# Patient Record
Sex: Male | Born: 1980
Health system: Southern US, Community
[De-identification: ages and names within clinical notes are randomized; demographics above are authoritative.]

## PROBLEM LIST (undated history)

## (undated) DIAGNOSIS — G709 Myoneural disorder, unspecified: Secondary | ICD-10-CM

## (undated) HISTORY — DX: Myoneural disorder, unspecified: G70.9

## (undated) HISTORY — PX: FRACTURE SURGERY: SHX138

---

## 2014-12-12 ENCOUNTER — Ambulatory Visit (INDEPENDENT_AMBULATORY_CARE_PROVIDER_SITE_OTHER): Payer: No Typology Code available for payment source

## 2014-12-12 ENCOUNTER — Ambulatory Visit (INDEPENDENT_AMBULATORY_CARE_PROVIDER_SITE_OTHER): Payer: No Typology Code available for payment source | Admitting: Family Medicine

## 2014-12-12 VITALS — BP 120/98 | HR 79 | Temp 98.0°F | Resp 16 | Wt 213.4 lb

## 2014-12-12 DIAGNOSIS — R29898 Other symptoms and signs involving the musculoskeletal system: Secondary | ICD-10-CM

## 2014-12-12 DIAGNOSIS — M545 Low back pain: Secondary | ICD-10-CM

## 2014-12-12 MED ORDER — MELOXICAM 7.5 MG PO TABS: 7.5000 mg | ORAL_TABLET | Freq: Every day | ORAL | Status: DC

## 2014-12-12 MED ORDER — CYCLOBENZAPRINE HCL 10 MG PO TABS: 5.0000 mg | ORAL_TABLET | Freq: Three times a day (TID) | ORAL | Status: DC | PRN

## 2014-12-12 MED ORDER — HYDROCODONE-ACETAMINOPHEN 5-325 MG PO TABS: 1.0000 | ORAL_TABLET | Freq: Three times a day (TID) | ORAL | Status: DC
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
## 2014-12-14 DIAGNOSIS — F4010 Social phobia, unspecified: Secondary | ICD-10-CM | POA: Insufficient documentation

## 2014-12-14 DIAGNOSIS — F411 Generalized anxiety disorder: Secondary | ICD-10-CM | POA: Insufficient documentation

## 2014-12-14 DIAGNOSIS — F39 Unspecified mood [affective] disorder: Secondary | ICD-10-CM | POA: Insufficient documentation

## 2014-12-14 NOTE — Progress Notes (Signed)
Patient discussed and examined with Mr. Chestine SporeClark. Agree with assessment and plan of care per his note. Initially concerned with listing on CSRS, but with date verification and hx provided - matched Rx. Also clarified that oxycodone was not tolerated, hydrocodone was. Agree with trial of prednisone, flexeril andthen mobic, but if increased pain in interim - it would be reasonable to provide short course of hydrocodone if needed. rtc precautions given and consider PT or back/ortho eval.

## 2014-12-28 ENCOUNTER — Telehealth: Payer: Self-pay

## 2014-12-28 DIAGNOSIS — M545 Low back pain: Secondary | ICD-10-CM

## 2014-12-28 MED ORDER — HYDROCODONE-ACETAMINOPHEN 5-325 MG PO TABS: 1.0000 | ORAL_TABLET | Freq: Three times a day (TID) | ORAL | Status: DC

## 2014-12-28 NOTE — Telephone Encounter (Signed)
Please call patient. I will refill once.  Patient will need to go to orthopedics per his referal.  If he prefers not to go to orthopedics then I will I will refer to pain clinic for chronic pain management.  Unfortunately I am not qualified to manage his chronic pain in the future.

## 2014-12-28 NOTE — Telephone Encounter (Signed)
Pt is requesting a refill of hydrocodone. °

## 2015-01-01 NOTE — Telephone Encounter (Signed)
Spoke with pt, advised message from Mr. Chestine SporeClark. He is planning to go to pain management and has appt set up.

## 2015-01-02 ENCOUNTER — Emergency Department (HOSPITAL_COMMUNITY): Payer: No Typology Code available for payment source

## 2015-01-02 ENCOUNTER — Encounter (HOSPITAL_COMMUNITY): Payer: Self-pay

## 2015-01-02 DIAGNOSIS — Z8669 Personal history of other diseases of the nervous system and sense organs: Secondary | ICD-10-CM | POA: Insufficient documentation

## 2015-01-02 DIAGNOSIS — Z87891 Personal history of nicotine dependence: Secondary | ICD-10-CM | POA: Insufficient documentation

## 2015-01-02 DIAGNOSIS — R531 Weakness: Secondary | ICD-10-CM | POA: Insufficient documentation

## 2015-01-02 DIAGNOSIS — R0789 Other chest pain: Secondary | ICD-10-CM | POA: Diagnosis not present

## 2015-01-02 DIAGNOSIS — Z791 Long term (current) use of non-steroidal anti-inflammatories (NSAID): Secondary | ICD-10-CM | POA: Insufficient documentation

## 2015-01-02 DIAGNOSIS — Z880 Allergy status to penicillin: Secondary | ICD-10-CM | POA: Diagnosis not present

## 2015-01-02 DIAGNOSIS — R200 Anesthesia of skin: Secondary | ICD-10-CM | POA: Diagnosis not present

## 2015-01-02 DIAGNOSIS — R55 Syncope and collapse: Secondary | ICD-10-CM | POA: Insufficient documentation

## 2015-01-02 DIAGNOSIS — R0602 Shortness of breath: Secondary | ICD-10-CM | POA: Diagnosis not present

## 2015-01-02 DIAGNOSIS — Z79899 Other long term (current) drug therapy: Secondary | ICD-10-CM | POA: Insufficient documentation

## 2015-01-02 DIAGNOSIS — F419 Anxiety disorder, unspecified: Secondary | ICD-10-CM | POA: Diagnosis not present

## 2015-01-02 DIAGNOSIS — R51 Headache: Secondary | ICD-10-CM | POA: Diagnosis not present

## 2015-01-02 DIAGNOSIS — R079 Chest pain, unspecified: Secondary | ICD-10-CM | POA: Diagnosis present

## 2015-01-02 DIAGNOSIS — R002 Palpitations: Secondary | ICD-10-CM | POA: Insufficient documentation

## 2015-01-02 LAB — CBC
HEMATOCRIT: 44.9 % (ref 39.0–52.0)
HEMOGLOBIN: 15.4 g/dL (ref 13.0–17.0)
MCH: 30 pg (ref 26.0–34.0)
MCHC: 34.3 g/dL (ref 30.0–36.0)
MCV: 87.5 fL (ref 78.0–100.0)
Platelets: 243 10*3/uL (ref 150–400)
RBC: 5.13 MIL/uL (ref 4.22–5.81)
RDW: 12.8 % (ref 11.5–15.5)
WBC: 5.9 10*3/uL (ref 4.0–10.5)

## 2015-01-02 LAB — BASIC METABOLIC PANEL
Anion gap: 7 (ref 5–15)
BUN: 12 mg/dL (ref 6–23)
CO2: 26 mmol/L (ref 19–32)
CREATININE: 0.77 mg/dL (ref 0.50–1.35)
Calcium: 9.2 mg/dL (ref 8.4–10.5)
Chloride: 105 mmol/L (ref 96–112)
GFR calc Af Amer: >90 mL/min (ref >90)
GLUCOSE: 104 mg/dL — AB (ref 70–99)
POTASSIUM: 3.8 mmol/L (ref 3.5–5.1)
Sodium: 138 mmol/L (ref 135–145)

## 2015-01-02 LAB — I-STAT TROPONIN, ED: Troponin i, poc: 0 ng/mL (ref 0.00–0.08)

## 2015-01-02 NOTE — ED Notes (Signed)
Pt. Reports sudden onset left sided chest pain, sharp stabbing with SOB. States he passed out and woke up on the floor with left arm numbness. Still having 6/10 chest pain. Reports extra stress recently. ON EMS arrival, patient was alert and oriented x4, ems reports patient anxious. Pt. Calm at this time, alert and oriented x4. Reports neck pain.

## 2015-01-02 NOTE — ED Notes (Signed)
Pt. Reporting increasing neck pain and HA. Refused ccollar.

## 2015-01-03 ENCOUNTER — Emergency Department (HOSPITAL_COMMUNITY)
Admission: EM | Admit: 2015-01-03 | Discharge: 2015-01-03 | Discharge status: Against Medical Advice | Payer: No Typology Code available for payment source | Attending: Emergency Medicine | Admitting: Emergency Medicine

## 2015-01-03 DIAGNOSIS — R0789 Other chest pain: Secondary | ICD-10-CM

## 2015-01-03 LAB — D-DIMER, QUANTITATIVE: D-Dimer, Quant: <0.27 ug/mL-FEU (ref 0.00–0.48)

## 2015-01-03 NOTE — ED Notes (Signed)
Went in to round on patient and pt was dressed and stated he was ready to leave. Told pt I would get the doctor to speak with him and he said okay. Patient walked out of room and exited the ED.

## 2015-01-03 NOTE — ED Provider Notes (Signed)
CSN: [id number]     Arrival date & time 01/02/15  2237 History  This chart was scribed for Loren Racer, MD by Annye Asa, ED Scribe. This patient was seen in room B14C/B14C and the patient's care was started at 12:23 AM.   Chief Complaint  Patient presents with  . Chest Pain   Patient is a 34 y.o. male presenting with chest pain. The history is provided by the patient and the spouse. No language interpreter was used.  Chest Pain Associated symptoms: headache, numbness, palpitations, shortness of breath and weakness   Associated symptoms: no abdominal pain, no back pain, no fever, no nausea and not vomiting      HPI Comments: Gregory Solomon is an otherwise healthy 34 y.o. male who presents to the Emergency Department complaining of sudden onset left-sided chest pain, described as "sharp, stabbing," around 21:00 tonight. He also reports left arm numbness. Patient explains that soon after symptom onset he "passed out," and woke up on the floor - this is how his wife found him, "curled up in a ball with his jaw locked up." Per nurse's note, on EMS arrival patient was alert and oriented but very anxious. Wife notes he has been under significant stress recently. He currently reports a "throbbing" pain in his chest, headache, and generalized weakness. He denies numbness or tingling in his legs, denies pain or swelling in his legs. He denies prior experience with similar symptoms, prior diagnosis of anxiety, denies recent surgery, denies any recent long-term periods of immobility (no plane or car trips over 6-8 hours).   Patient is a nonsmoker. Paternal history of MI at 14.   Past Medical History  Diagnosis Date  . Neuromuscular disorder    Past Surgical History  Procedure Laterality Date  . Fracture surgery     Family History  Problem Relation Age of Onset  . Cancer Mother   . Hypertension Mother   . Hypertension Father    History  Substance Use Topics  . Smoking status: Former Games developer   . Smokeless tobacco: Not on file  . Alcohol Use: No    Review of Systems  Constitutional: Negative for fever and chills.  Respiratory: Positive for shortness of breath.   Cardiovascular: Positive for chest pain and palpitations. Negative for leg swelling.  Gastrointestinal: Negative for nausea, vomiting, abdominal pain and diarrhea.  Musculoskeletal: Negative for myalgias, back pain, neck pain and neck stiffness.  Skin: Negative for rash and wound.  Neurological: Positive for syncope, weakness, numbness and headaches.  Psychiatric/Behavioral: The patient is nervous/anxious.   All other systems reviewed and are negative.   Allergies  Penicillins  Home Medications   Prior to Admission medications   Medication Sig Start Date End Date Taking? Authorizing Provider  HYDROcodone-acetaminophen (LORTAB) 5-325 MG per tablet Take 1 tablet by mouth every 8 (eight) hours. 12/28/14  Yes Dolores Lory, PA-C  meloxicam (MOBIC) 7.5 MG tablet Take 1-2 tablets (7.5-15 mg total) by mouth daily. Start after finishing prednisone. 12/12/14  Yes Dolores Lory, PA-C  cyclobenzaprine (FLEXERIL) 10 MG tablet Take 0.5 tablets (5 mg total) by mouth 3 (three) times daily as needed for muscle spasms. Patient not taking: Reported on 01/03/2015 12/12/14   Dolores Lory, PA-C   BP 152/94 mmHg  Pulse 81  Temp(Src) 97.8 F (36.6 C)  Resp 12  SpO2 100% Physical Exam  Constitutional: He is oriented to person, place, and time. He appears well-developed and well-nourished. No distress.  Very anxious appearing  HENT:  Head: Normocephalic and atraumatic.  Mouth/Throat: Oropharynx is clear and moist. No oropharyngeal exudate.  No intraoral trauma.  Eyes: EOM are normal. Pupils are equal, round, and reactive to light.  Neck: Normal range of motion. Neck supple.  No posterior midline cervical tenderness to palpation.  Cardiovascular: Normal rate and regular rhythm.  Exam reveals no gallop and no friction  rub.   No murmur heard. Pulmonary/Chest: Effort normal and breath sounds normal. No respiratory distress. He has no wheezes. He has no rales. He exhibits no tenderness.  Abdominal: Soft. Bowel sounds are normal. He exhibits no distension. There is no tenderness. There is no rebound and no guarding.  Musculoskeletal: Normal range of motion. He exhibits no edema or tenderness.  No calf swelling or tenderness.  Neurological: He is alert and oriented to person, place, and time.  5/5 motor in all extremities. Sensation is grossly intact.  Skin: Skin is warm and dry. No rash noted. No erythema.  Nursing note and vitals reviewed.   ED Course  Procedures   DIAGNOSTIC STUDIES: Oxygen Saturation is 99% on RA, adequate by my interpretation.    COORDINATION OF CARE: 12:30 AM Discussed treatment plan with pt at bedside and pt agreed to plan.   Labs Review Labs Reviewed  BASIC METABOLIC PANEL - Abnormal; Notable for the following:    Glucose, Bld 104 (*)    All other components within normal limits  CBC  D-DIMER, QUANTITATIVE  I-STAT TROPOININ, ED    Imaging Review Dg Chest 2 View  01/03/2015   CLINICAL DATA:  Acute onset of centralized chest pain and left arm numbness. Syncope. Initial encounter.  EXAM: CHEST  2 VIEW  COMPARISON:  None.  FINDINGS: The lungs are well-aerated and clear. There is no evidence of focal opacification, pleural effusion or pneumothorax.  The heart is normal in size; the mediastinal contour is within normal limits. No acute osseous abnormalities are seen.  IMPRESSION: No acute cardiopulmonary process seen.   Electronically Signed   By: Roanna RaiderJeffery  Chang M.D.   On: 01/03/2015 00:15     EKG Interpretation   Date/Time:  Wednesday January 02 2015 22:48:46 EST Ventricular Rate:  85 PR Interval:  140 QRS Duration: 102 QT Interval:  364 QTC Calculation: 433 R Axis:   67 Text Interpretation:  Normal sinus rhythm with sinus arrhythmia Normal ECG  Confirmed by Ranae PalmsYELVERTON   MD, Paolo Okane ([id number]) on 01/03/2015 12:11:26 AM      MDM   Final diagnoses:  Atypical chest pain    I personally performed the services described in this documentation, which was scribed in my presence. The recorded information has been reviewed and is accurate.  Patient left AMA prior to receiving discharge instructions. Doubts patient's symptoms were related to coronary artery disease. Likely stress/anxiety induced.     Loren Raceravid Sael Furches, MD 01/03/15 [phone]

## 2015-01-03 NOTE — ED Notes (Signed)
Pt ama. Pt a/o x 4 with steady gait.

## 2015-01-10 DIAGNOSIS — G894 Chronic pain syndrome: Secondary | ICD-10-CM | POA: Insufficient documentation

## 2015-01-10 DIAGNOSIS — M545 Low back pain, unspecified: Secondary | ICD-10-CM | POA: Insufficient documentation

## 2015-01-10 DIAGNOSIS — M75102 Unspecified rotator cuff tear or rupture of left shoulder, not specified as traumatic: Secondary | ICD-10-CM | POA: Insufficient documentation

## 2015-01-18 DIAGNOSIS — M47819 Spondylosis without myelopathy or radiculopathy, site unspecified: Secondary | ICD-10-CM

## 2015-01-18 DIAGNOSIS — M47899 Other spondylosis, site unspecified: Secondary | ICD-10-CM | POA: Insufficient documentation

## 2015-10-31 ENCOUNTER — Ambulatory Visit: Payer: No Typology Code available for payment source | Admitting: Family

## 2015-12-23 MED FILL — SUBOXONE 8 MG-2 MG SL FILM: 8-2 | 30 days supply | Qty: 90 | Fill #0

## 2016-01-16 MED FILL — SUBOXONE 8 MG-2 MG SL FILM: 8-2 | 30 days supply | Qty: 90 | Fill #0

## 2016-02-17 MED FILL — SUBOXONE 8 MG-2 MG SL FILM: 8-2 | 28 days supply | Qty: 84 | Fill #0

## 2016-03-19 MED FILL — SUBOXONE 8 MG-2 MG SL FILM: 8-2 | 28 days supply | Qty: 84 | Fill #0

## 2016-04-21 ENCOUNTER — Ambulatory Visit (INDEPENDENT_AMBULATORY_CARE_PROVIDER_SITE_OTHER): Payer: BLUE CROSS/BLUE SHIELD | Admitting: Podiatry

## 2016-04-21 ENCOUNTER — Encounter: Payer: Self-pay | Admitting: Podiatry

## 2016-04-21 VITALS — BP 138/82 | HR 66 | Resp 16

## 2016-04-21 DIAGNOSIS — M75100 Unspecified rotator cuff tear or rupture of unspecified shoulder, not specified as traumatic: Secondary | ICD-10-CM | POA: Insufficient documentation

## 2016-04-21 DIAGNOSIS — L603 Nail dystrophy: Secondary | ICD-10-CM

## 2016-04-21 DIAGNOSIS — M79643 Pain in unspecified hand: Secondary | ICD-10-CM | POA: Insufficient documentation

## 2016-04-21 DIAGNOSIS — M79673 Pain in unspecified foot: Secondary | ICD-10-CM | POA: Insufficient documentation

## 2016-04-21 MED FILL — SUBOXONE 8 MG-2 MG SL FILM: 8-2 | 28 days supply | Qty: 84 | Fill #0

## 2016-04-21 NOTE — Progress Notes (Signed)
He presents today as a new patient with a chief complaint of severe thickened fungal toenails that he has had for the past 20 years. He states that he is very embarrassed by them and no one ever sees his feet. He states that it is pretty much isolated to the nail that he does have some rash around the toes and plantar aspect of the foot but does not have them in his toenails. 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  Objective: Vital signs are stable he is alert and oriented 3 have reviewed his past medical history medications allergies surgeries and social history. Pulses are palpable neurologic sensorium is intact tendon reflexes are intact muscle strength is normal bilateral. Orthopedic evaluation of its all joints distal to the ankle range of motion without crepitation. Cutaneous evaluation does demonstrate tinea pedis plantar aspect of the forefoot with thick and discolored probable mycotic nails.  Assessment: Nail dystrophy with tinea pedis.  Plan: At this point we sampled the skin and nail to be sent for pathologic evaluation. Should this come back as a nail dystrophy I will still more than likely treat him with an oral antifungal.

## 2016-05-19 ENCOUNTER — Encounter: Payer: Self-pay | Admitting: Podiatry

## 2016-05-19 ENCOUNTER — Ambulatory Visit (INDEPENDENT_AMBULATORY_CARE_PROVIDER_SITE_OTHER): Payer: BLUE CROSS/BLUE SHIELD | Admitting: Podiatry

## 2016-05-19 DIAGNOSIS — L603 Nail dystrophy: Secondary | ICD-10-CM

## 2016-05-19 DIAGNOSIS — Z79899 Other long term (current) drug therapy: Secondary | ICD-10-CM

## 2016-05-19 MED ORDER — TERBINAFINE HCL 250 MG PO TABS: 250.0000 mg | ORAL_TABLET | Freq: Every day | ORAL | Status: DC

## 2016-05-19 MED ORDER — TERBINAFINE HCL 250 MG PO TABS
250.0000 mg | ORAL_TABLET | Freq: Every day | ORAL | Status: DC
Start: 2016-05-19 — End: 2016-06-16

## 2016-05-19 NOTE — Progress Notes (Signed)
He presents today for follow-up of his fungal report.  Objective: Vital signs are stable he is alert and oriented 3. His fungal report does demonstrate a typical dermatophyte itches infecting his toenails.  Assessment: Onychomycosis.  Plan: We discussed the etiology pathology conservative versus surgical therapies. At this point he agreed to start oral Lamisil therapy. He will take 1 250 mg tablet daily by mouth and will follow-up with me in 1 month for another blood work and a 90 day prescription at that time. He will watch for signs and symptoms of allergies. We requested blood work to be performed within the next 2-3 days for liver profile and assessment. Follow up with him in 1 month.

## 2016-05-19 NOTE — Patient Instructions (Signed)

## 2016-05-21 MED FILL — SUBOXONE 8 MG-2 MG SL FILM: 8-2 | 28 days supply | Qty: 84 | Fill #0

## 2016-06-16 ENCOUNTER — Encounter: Payer: Self-pay | Admitting: Podiatry

## 2016-06-16 ENCOUNTER — Ambulatory Visit (INDEPENDENT_AMBULATORY_CARE_PROVIDER_SITE_OTHER): Payer: BLUE CROSS/BLUE SHIELD | Admitting: Podiatry

## 2016-06-16 DIAGNOSIS — L603 Nail dystrophy: Secondary | ICD-10-CM

## 2016-06-16 DIAGNOSIS — Z79899 Other long term (current) drug therapy: Secondary | ICD-10-CM | POA: Diagnosis not present

## 2016-06-16 MED ORDER — TERBINAFINE HCL 250 MG PO TABS: 250.0000 mg | ORAL_TABLET | Freq: Every day | ORAL | 0 refills | Status: DC

## 2016-06-17 NOTE — Progress Notes (Signed)
He presents today for follow-up of his Lamisil therapy. He states that he can already see results clearing at the base of the toes as well as the skin seems to be clearing up around the toes and of the midfoot. He denies fever chills nausea vomiting muscle aches pain itching rashing.  Objective: Onychomycosis no change.  Assessment: Onychomycosis long-term therapy with Lamisil.  Plan: Request another liver profile CBC and prescribed his Lamisil 250 mg tablets 1 by mouth daily 90 days follow-up with him in 4 months. We will notify him with questions or concerns and notify him of his lab findings.

## 2016-06-18 ENCOUNTER — Encounter: Payer: Self-pay | Admitting: Family Medicine

## 2016-06-18 ENCOUNTER — Ambulatory Visit (INDEPENDENT_AMBULATORY_CARE_PROVIDER_SITE_OTHER): Payer: BLUE CROSS/BLUE SHIELD | Admitting: Family Medicine

## 2016-06-18 VITALS — BP 130/90 | HR 70 | Wt 204.8 lb

## 2016-06-18 DIAGNOSIS — Z8679 Personal history of other diseases of the circulatory system: Secondary | ICD-10-CM | POA: Diagnosis not present

## 2016-06-18 DIAGNOSIS — F1110 Opioid abuse, uncomplicated: Secondary | ICD-10-CM | POA: Diagnosis not present

## 2016-06-18 DIAGNOSIS — Z7689 Persons encountering health services in other specified circumstances: Secondary | ICD-10-CM

## 2016-06-18 DIAGNOSIS — Z7189 Other specified counseling: Secondary | ICD-10-CM

## 2016-06-18 DIAGNOSIS — F411 Generalized anxiety disorder: Secondary | ICD-10-CM | POA: Diagnosis not present

## 2016-06-18 DIAGNOSIS — F1111 Opioid abuse, in remission: Secondary | ICD-10-CM

## 2016-06-18 MED FILL — SUBOXONE 8 MG-2 MG SL FILM: 8-2 | 28 days supply | Qty: 84 | Fill #0

## 2016-06-18 NOTE — Patient Instructions (Addendum)
You can call to schedule your appointment with the Counselor. A couple offices are listed below for you to call.    Darryl A. Hyers, RN, PhD, [facility]  [address], [place], [place] [address] Phone: [phone]  [facility]  [address], [place], [place] [address] Phone: [phone]    DASH Eating Plan DASH stands for "Dietary Approaches to Stop Hypertension." The DASH eating plan is a healthy eating plan that has been shown to reduce high blood pressure (hypertension). Additional health benefits may include reducing the risk of type 2 diabetes mellitus, heart disease, and stroke. The DASH eating plan may also help with weight loss. WHAT DO I NEED TO KNOW ABOUT THE DASH EATING PLAN? For the DASH eating plan, you will follow these general guidelines:  Choose foods with a percent daily value for sodium of less than 5% (as listed on the food label).  Use salt-free seasonings or herbs instead of table salt or sea salt.  Check with your health care provider or pharmacist before using salt substitutes.  Eat lower-sodium products, often labeled as "lower sodium" or "no salt added."  Eat fresh foods.  Eat more vegetables, fruits, and low-fat dairy products.  Choose whole grains. Look for the word "whole" as the first word in the ingredient list.  Choose fish and skinless chicken or Mal[place]key more often than red meat. Limit fish, poultry, and meat to 6 oz (170 g) each day.  Limit sweets, desserts, sugars, and sugary drinks.  Choose heart-healthy fats.  Limit cheese to 1 oz (28 g) per day.  Eat more home-cooked food and less restaurant, buffet, and fast food.  Limit fried foods.  Cook foods using methods other than frying.  Limit canned vegetables. If you do use them, rinse them well to decrease the sodium.  When eating at a restaurant, ask that your food be prepared with less salt, or no salt if possible. WHAT FOODS CAN I EAT? Seek help from a dietitian  for individual calorie needs. Grains Whole grain or whole wheat bread. Brown rice. Whole grain or whole wheat pasta. Quinoa, bulgur, and whole grain cereals. Low-sodium cereals. Corn or whole wheat flour tortillas. Whole grain cornbread. Whole grain crackers. Low-sodium crackers. Vegetables Fresh or frozen vegetables (raw, steamed, roasted, or grilled). Low-sodium or reduced-sodium tomato and vegetable juices. Low-sodium or reduced-sodium tomato sauce and paste. Low-sodium or reduced-sodium canned vegetables.  Fruits All fresh, canned (in natural juice), or frozen fruits. Meat and Other Protein Products Ground beef (85% or leaner), grass-fed beef, or beef trimmed of fat. Skinless chicken or [place]key. Ground chicken or [place]key. Pork trimmed of fat. All fish and seafood. Eggs. Dried beans, peas, or lentils. Unsalted nuts and seeds. Unsalted canned beans. Dairy Low-fat dairy products, such as skim or 1% milk, 2% or reduced-fat cheeses, low-fat ricotta or cottage cheese, or plain low-fat yogurt. Low-sodium or reduced-sodium cheeses. Fats and Oils Tub margarines without trans fats. Light or reduced-fat mayonnaise and salad dressings (reduced sodium). Avocado. Safflower, olive, or canola oils. Natural peanut or almond butter. Other Unsalted popcorn and pretzels. The items listed above may not be a complete list of recommended foods or beverages. Contact your dietitian for more options. WHAT FOODS ARE NOT RECOMMENDED? Grains White bread. White pasta. White rice. Refined cornbread. Bagels and croissants. Crackers that contain trans fat. Vegetables Creamed or fried vegetables. Vegetables in a cheese sauce. Regular canned vegetables. Regular canned tomato sauce and paste. Regular tomato and vegetable juices. Fruits Dried  fruits. Canned fruit in light or heavy syrup. Fruit juice. Meat and Other Protein Products Fatty cuts of meat. Ribs, chicken wings, bacon, sausage, bologna, salami, chitterlings, fatback,  hot dogs, bratwurst, and packaged luncheon meats. Salted nuts and seeds. Canned beans with salt. Dairy Whole or 2% milk, cream, half-and-half, and cream cheese. Whole-fat or sweetened yogurt. Full-fat cheeses or blue cheese. Nondairy creamers and whipped toppings. Processed cheese, cheese spreads, or cheese curds. Condiments Onion and garlic salt, seasoned salt, table salt, and sea salt. Canned and packaged gravies. Worcestershire sauce. Tartar sauce. Barbecue sauce. Teriyaki sauce. Soy sauce, including reduced sodium. Steak sauce. Fish sauce. Oyster sauce. Cocktail sauce. Horseradish. Ketchup and mustard. Meat flavorings and tenderizers. Bouillon cubes. Hot sauce. Tabasco sauce. Marinades. Taco seasonings. Relishes. Fats and Oils Butter, stick margarine, lard, shortening, ghee, and bacon fat. Coconut, palm kernel, or palm oils. Regular salad dressings. Other Pickles and olives. Salted popcorn and pretzels. The items listed above may not be a complete list of foods and beverages to avoid. Contact your dietitian for more information. WHERE CAN I FIND MORE INFORMATION? National Heart, Lung, and Blood Institute: CableProm[employer].itwww.nhlbi.nih.gov/health/health-topics/topics/dash/   This information is not intended to replace advice given to you by your health care provider. Make sure you discuss any questions you have with your health care provider.   Document Released: 10/08/2011 Document Revised: 11/09/2014 Document Reviewed: 08/23/2013 Elsevier Interactive Patient Education [employer]2016 [employer] Inc.
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
## 2016-07-27 ENCOUNTER — Encounter: Payer: BLUE CROSS/BLUE SHIELD | Admitting: Family Medicine

## 2016-08-04 MED FILL — SUBOXONE 8 MG-2 MG SL FILM: 8-2 | 28 days supply | Qty: 84 | Fill #0

## 2016-08-07 ENCOUNTER — Encounter: Payer: Self-pay | Admitting: Family Medicine

## 2016-09-01 MED FILL — SUBOXONE 8 MG-2 MG SL FILM: 8-2 | 28 days supply | Qty: 84 | Fill #0

## 2016-09-07 ENCOUNTER — Ambulatory Visit: Payer: BLUE CROSS/BLUE SHIELD | Admitting: Family Medicine

## 2016-09-10 ENCOUNTER — Other Ambulatory Visit: Payer: Self-pay | Admitting: Podiatry

## 2016-10-02 ENCOUNTER — Other Ambulatory Visit: Payer: Self-pay | Admitting: Podiatry

## 2016-10-08 ENCOUNTER — Telehealth: Payer: Self-pay | Admitting: *Deleted

## 2016-10-08 MED FILL — SUBOXONE 8 MG-2 MG SL FILM: 8-2 | 28 days supply | Qty: 84 | Fill #0

## 2016-10-08 NOTE — Telephone Encounter (Signed)
Pt states he is about to run out of his 3 month prescription and wanted to know if he needed a new one. I told pt if he completed 90 doses he has completed therapeutic dosing, it takes 6-9 months to see healthy outgrowth. I told pt he has an appt 10/13/2016 at 1:15Pm. Pt states he will be out of town until 10/15/2016 and I transferred to schedulers for change of appt.

## 2016-10-13 ENCOUNTER — Ambulatory Visit: Payer: BLUE CROSS/BLUE SHIELD | Admitting: Podiatry

## 2016-10-15 ENCOUNTER — Encounter: Payer: Self-pay | Admitting: Podiatry

## 2016-10-15 ENCOUNTER — Ambulatory Visit (INDEPENDENT_AMBULATORY_CARE_PROVIDER_SITE_OTHER): Payer: BLUE CROSS/BLUE SHIELD | Admitting: Podiatry

## 2016-10-15 DIAGNOSIS — L603 Nail dystrophy: Secondary | ICD-10-CM | POA: Diagnosis not present

## 2016-10-15 DIAGNOSIS — Z79899 Other long term (current) drug therapy: Secondary | ICD-10-CM

## 2016-10-15 MED ORDER — TERBINAFINE HCL 250 MG PO TABS: 250.0000 mg | ORAL_TABLET | Freq: Every day | ORAL | 0 refills | Status: DC

## 2016-10-18 NOTE — Progress Notes (Signed)
He presents today for follow-up of his onychomycosis. States that he is doing very well he's well satisfied with the appearance of his toenails.  Objective: Vital signs are stable he is alert and oriented 3. Pulses are palpable. Toenails are proximally 75-80% cleared at this point that thinned down remarkably well and cleared.  Assessment: Well-healing onychomycosis with long-term therapy with Lamisil.  Plan: I want to continue him on Lamisil 250 mg tablets 1 by mouth every other day for the next 2 months and I will follow-up with him at the third month. He will call with questions or concerns or any side effects.

## 2016-11-15 ENCOUNTER — Other Ambulatory Visit: Payer: Self-pay | Admitting: Podiatry

## 2016-12-07 ENCOUNTER — Other Ambulatory Visit: Payer: Self-pay | Admitting: Podiatry

## 2016-12-31 ENCOUNTER — Encounter (INDEPENDENT_AMBULATORY_CARE_PROVIDER_SITE_OTHER): Payer: BLUE CROSS/BLUE SHIELD | Admitting: Podiatry

## 2016-12-31 NOTE — Progress Notes (Signed)
This encounter was created in error - please disregard.

## 2017-01-04 ENCOUNTER — Telehealth: Payer: Self-pay | Admitting: *Deleted

## 2017-01-04 NOTE — Telephone Encounter (Signed)
Pt's wife, Aline BrochureChristi states pt missed last weeks appt, next appt 01/26/2017, needs a refill of the medication. I informed pt's wife, Aline BrochureChristi the medication terbinafine would remain in his system at least 1 month.

## 2017-01-07 ENCOUNTER — Ambulatory Visit: Payer: BLUE CROSS/BLUE SHIELD | Admitting: Podiatry

## 2017-01-14 ENCOUNTER — Other Ambulatory Visit: Payer: Self-pay | Admitting: Family Medicine

## 2017-01-14 ENCOUNTER — Ambulatory Visit
Admission: RE | Admit: 2017-01-14 | Discharge: 2017-01-14 | Disposition: A | Discharge status: Home / Self Care | Payer: Self-pay | Source: Ambulatory Visit | Attending: Family Medicine | Admitting: Family Medicine

## 2017-01-14 DIAGNOSIS — M546 Pain in thoracic spine: Secondary | ICD-10-CM

## 2017-01-26 ENCOUNTER — Ambulatory Visit (INDEPENDENT_AMBULATORY_CARE_PROVIDER_SITE_OTHER): Payer: BLUE CROSS/BLUE SHIELD | Admitting: Podiatry

## 2017-01-26 ENCOUNTER — Encounter: Payer: Self-pay | Admitting: Podiatry

## 2017-01-26 DIAGNOSIS — L603 Nail dystrophy: Secondary | ICD-10-CM | POA: Diagnosis not present

## 2017-01-26 MED ORDER — TERBINAFINE HCL 250 MG PO TABS: 250.0000 mg | ORAL_TABLET | Freq: Every day | ORAL | 0 refills | Status: DC

## 2017-01-27 NOTE — Progress Notes (Signed)
He presents today and states these completed the Lamisil therapy notices the nails are starting to look a little bit thicker on the in's once again. Continue therapy.  Objective: Vital signs are stable alert and oriented 3. Pulses are palpable. Neurologic sensorium is intact. His nails are starting to look a little more thick on the distal aspect and they did previously go they have grown out considerably since I saw him last. He is always 100% clear.  Assessment: Well-healing onychomycosis.  Plan: Recommend 3 months of Lamisil 1 a day. Follow up with him in 4 months.

## 2017-05-04 ENCOUNTER — Ambulatory Visit (INDEPENDENT_AMBULATORY_CARE_PROVIDER_SITE_OTHER): Payer: BLUE CROSS/BLUE SHIELD

## 2017-05-04 ENCOUNTER — Ambulatory Visit (INDEPENDENT_AMBULATORY_CARE_PROVIDER_SITE_OTHER): Payer: BLUE CROSS/BLUE SHIELD | Admitting: Podiatry

## 2017-05-04 ENCOUNTER — Encounter: Payer: Self-pay | Admitting: Podiatry

## 2017-05-04 DIAGNOSIS — M898X9 Other specified disorders of bone, unspecified site: Secondary | ICD-10-CM

## 2017-05-04 DIAGNOSIS — L600 Ingrowing nail: Secondary | ICD-10-CM

## 2017-05-04 MED ORDER — OXYCODONE-ACETAMINOPHEN 10-325 MG PO TABS: 1.0000 | ORAL_TABLET | ORAL | 0 refills | Status: DC | PRN

## 2017-05-04 MED ORDER — NEOMYCIN-POLYMYXIN-HC 1 % OT SOLN: OTIC | 1 refills | Status: DC

## 2017-05-04 NOTE — Patient Instructions (Addendum)

## 2017-05-04 NOTE — Progress Notes (Signed)
He presents today with chief complaint of painful hallux nail left. He is disappointed that this nail has not going to clear up like many of the others did with the use of Lamisil. He states his toe is extremely painful red swollen hurts when water touches it or it in water like swimming for shower. He states this just killing me and is ready to take the toenail off.  Objective: Vital signs are stable alert and oriented 3. Pulses are palpable. Neurologic sensorium is intact. Degenerative flexor intact. Muscle strength is normal. Hallux nail and all of the remainder nails. Be healing well with exception of the hallux left. The hallux itself is erythematous around the medial and lateral borders as well as the distal border. Nails thick and discolored and painful. Radiographs taken today do not demonstrate a subungual exostosis.  Assessment: Ingrown nail paronychia abscess hallux left.  Plan: Total nail avulsion today. This was performed with local anesthetic and he tolerated the procedure well. He was given both oral and home-going instructions as well as prescription for pain minutes dictation. The home or going instructions will discuss soaking twice daily application of Neosporin or Cortisporin Otic which was prescribed. And I will follow-up with him in 2 weeks. Should there be questions or concerns he will notify us immediately.

## 2017-05-19 ENCOUNTER — Telehealth: Payer: Self-pay | Admitting: *Deleted

## 2017-05-19 NOTE — Telephone Encounter (Signed)
Received request for refill of 90 lamisil. Request denied pt receive #90 therapeutic dosing and pt is to make an appt to be seen in 05/2017 or 06/2017 for reevaluation. Return fax denied lamisil refill.

## 2017-05-20 ENCOUNTER — Ambulatory Visit: Payer: BLUE CROSS/BLUE SHIELD | Admitting: Podiatry

## 2017-06-01 ENCOUNTER — Encounter (INDEPENDENT_AMBULATORY_CARE_PROVIDER_SITE_OTHER): Payer: BLUE CROSS/BLUE SHIELD | Admitting: Podiatry

## 2017-06-01 NOTE — Progress Notes (Signed)
This encounter was created in error - please disregard.

## 2017-08-03 ENCOUNTER — Other Ambulatory Visit: Payer: Self-pay | Admitting: Neurosurgery

## 2017-08-03 DIAGNOSIS — M5441 Lumbago with sciatica, right side: Secondary | ICD-10-CM

## 2017-08-20 ENCOUNTER — Inpatient Hospital Stay
Admission: RE | Admit: 2017-08-20 | Discharge: 2017-08-20 | Disposition: A | Discharge status: Home / Self Care | Payer: BLUE CROSS/BLUE SHIELD | Source: Ambulatory Visit | Attending: Neurosurgery | Admitting: Neurosurgery

## 2017-08-20 DIAGNOSIS — M79672 Pain in left foot: Secondary | ICD-10-CM

## 2017-08-20 DIAGNOSIS — M79671 Pain in right foot: Secondary | ICD-10-CM | POA: Insufficient documentation

## 2018-01-18 ENCOUNTER — Encounter (HOSPITAL_COMMUNITY): Payer: Self-pay | Admitting: Emergency Medicine

## 2018-01-18 ENCOUNTER — Emergency Department (HOSPITAL_COMMUNITY)
Admission: EM | Admit: 2018-01-18 | Discharge: 2018-01-18 | Disposition: A | Discharge status: Home / Self Care | Payer: BLUE CROSS/BLUE SHIELD | Attending: Emergency Medicine | Admitting: Emergency Medicine

## 2018-01-18 DIAGNOSIS — R112 Nausea with vomiting, unspecified: Secondary | ICD-10-CM | POA: Diagnosis not present

## 2018-01-18 DIAGNOSIS — Z5321 Procedure and treatment not carried out due to patient leaving prior to being seen by health care provider: Secondary | ICD-10-CM | POA: Diagnosis not present

## 2018-01-18 DIAGNOSIS — R109 Unspecified abdominal pain: Secondary | ICD-10-CM | POA: Diagnosis present

## 2018-01-18 LAB — CBC
HCT: 45.8 % (ref 39.0–52.0)
Hemoglobin: 15.9 g/dL (ref 13.0–17.0)
MCH: 30.7 pg (ref 26.0–34.0)
MCHC: 34.7 g/dL (ref 30.0–36.0)
MCV: 88.4 fL (ref 78.0–100.0)
Platelets: 264 10*3/uL (ref 150–400)
RBC: 5.18 MIL/uL (ref 4.22–5.81)
RDW: 13.1 % (ref 11.5–15.5)
WBC: 16.3 10*3/uL — ABNORMAL HIGH (ref 4.0–10.5)

## 2018-01-18 LAB — COMPREHENSIVE METABOLIC PANEL
ALT: 17 U/L (ref 17–63)
AST: 15 U/L (ref 15–41)
Albumin: 4 g/dL (ref 3.5–5.0)
Alkaline Phosphatase: 75 U/L (ref 38–126)
Anion gap: 11 (ref 5–15)
BUN: 9 mg/dL (ref 6–20)
CO2: 22 mmol/L (ref 22–32)
Calcium: 8.9 mg/dL (ref 8.9–10.3)
Chloride: 105 mmol/L (ref 101–111)
Creatinine, Ser: 0.64 mg/dL (ref 0.61–1.24)
GFR calc Af Amer: >60 mL/min (ref >60)
GFR calc non Af Amer: >60 mL/min (ref >60)
Glucose, Bld: 102 mg/dL — ABNORMAL HIGH (ref 65–99)
Potassium: 3.7 mmol/L (ref 3.5–5.1)
Sodium: 138 mmol/L (ref 135–145)
Total Bilirubin: 0.6 mg/dL (ref 0.3–1.2)
Total Protein: 6.8 g/dL (ref 6.5–8.1)

## 2018-01-18 LAB — URINALYSIS, ROUTINE W REFLEX MICROSCOPIC
Bilirubin Urine: NEGATIVE
Glucose, UA: NEGATIVE mg/dL
Hgb urine dipstick: NEGATIVE
Ketones, ur: 5 mg/dL — AB
Leukocytes, UA: NEGATIVE
Nitrite: NEGATIVE
Protein, ur: NEGATIVE mg/dL
Specific Gravity, Urine: 1.026 (ref 1.005–1.030)
pH: 5 (ref 5.0–8.0)

## 2018-01-18 LAB — LIPASE, BLOOD: Lipase: 167 U/L — ABNORMAL HIGH (ref 11–51)

## 2018-01-18 MED ORDER — IBUPROFEN 400 MG PO TABS
400.0000 mg | ORAL_TABLET | Freq: Once | ORAL | Status: DC | PRN
Start: 2018-01-18 — End: 2018-01-19
  Filled 2018-01-18: qty 1

## 2018-01-18 MED ORDER — ONDANSETRON 4 MG PO TBDP
4.0000 mg | ORAL_TABLET | Freq: Once | ORAL | Status: AC | PRN
  Administered 2018-01-18: 4 mg via ORAL
  Filled 2018-01-18: qty 1
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
## 2018-01-18 NOTE — ED Triage Notes (Signed)
Pt reports gen abd pain onset this AM with N/V. Pt states pain is intermittent, cramping, 5/10.

## 2019-05-09 ENCOUNTER — Ambulatory Visit
Admission: EM | Admit: 2019-05-09 | Discharge: 2019-05-09 | Disposition: A | Discharge status: Home / Self Care | Payer: BLUE CROSS/BLUE SHIELD | Attending: Physician Assistant | Admitting: Physician Assistant

## 2019-05-09 ENCOUNTER — Ambulatory Visit (INDEPENDENT_AMBULATORY_CARE_PROVIDER_SITE_OTHER): Payer: BLUE CROSS/BLUE SHIELD

## 2019-05-09 DIAGNOSIS — M79641 Pain in right hand: Secondary | ICD-10-CM | POA: Diagnosis not present

## 2019-05-09 MED ORDER — MELOXICAM 7.5 MG PO TABS: 7.5000 mg | ORAL_TABLET | Freq: Every day | ORAL | 0 refills | Status: DC

## 2019-05-09 NOTE — ED Provider Notes (Signed)
[facility]    CSN: [id number] Arrival date & time: 05/09/19  1232      History   Chief Complaint Chief Complaint  Patient presents with   Hand Pain    HPI Gregory Solomon is a 38 y.o. male.   38 year old male comes in for right hand injury with pain shortly prior to arrival.  Patient is right-hand dominant.  States a bulldozer bucket linkage fell onto his hand.  He has swelling, small abrasions to the right 3rd-5th distal MCPs.  Decreased range of motion due to swelling and pain.  Has numbness tingling throughout the finger and hand.  Has not taken anything for the symptoms.     Past Medical History:  Diagnosis Date   Neuromuscular disorder [facility])     Patient Active Problem List   Diagnosis Date Noted   Foot pain, bilateral 08/20/2017   Foot pain 04/21/2016   Hand pain 04/21/2016   Rotator cuff syndrome 04/21/2016   Facet syndrome ([place]) 01/18/2015   Bilateral low back pain without sciatica 01/10/2015   Chronic pain associated with significant psychosocial dysfunction 01/10/2015   Left rotator cuff tear 01/10/2015   Chronic pain syndrome 01/10/2015   Mood disorder ([place]) 12/14/2014   Generalized anxiety disorder 12/14/2014   Social anxiety disorder 12/14/2014    Past Surgical History:  Procedure Laterality Date   FRACTURE SURGERY         Home Medications    Prior to Admission medications   Medication Sig Start Date End Date Taking? Authorizing Provider  Buprenorphine HCl-Naloxone HCl 8-2 MG FILM Place 1 Film under the tongue 3 (three) times daily as needed.    [provider]  cyclobenzaprine (FLEXERIL) 10 MG tablet  04/28/17   [provider]  meloxicam (MOBIC) 7.5 MG tablet Take 1 tablet (7.5 mg total) by mouth daily. 05/09/19   Tasia Catchings, Amerigo Mcglory V, PA-C  NEOMYCIN-POLYMYXIN-HYDROCORTISONE (CORTISPORIN) 1 % SOLN OTIC solution Apply 1-2 drops to toe BID after soaking 05/04/17   Hyatt, Max T, DPM  oxyCODONE-acetaminophen  (PERCOCET) 10-325 MG tablet Take 1 tablet by mouth every 4 (four) hours as needed for pain. 05/04/17   Hyatt, Max T, DPM    Family History Family History  Problem Relation Age of Onset   Cancer Mother    Hypertension Mother    Hypertension Father     Social History Social History   Tobacco Use   Smoking status: Former Smoker   Smokeless tobacco: Never Used  Substance Use Topics   Alcohol use: No    Alcohol/week: 0.0 standard drinks   Drug use: Yes    Types: Marijuana     Allergies   Hydrocodone and Penicillins   Review of Systems Review of Systems  Reason unable to perform ROS: See HPI as above.     Physical Exam Triage Vital Signs ED Triage Vitals  Enc Vitals Group     BP 05/09/19 1243 119/77     Pulse Rate 05/09/19 1243 (!) 117     Resp 05/09/19 1243 20     Temp 05/09/19 1243 98.1 F (36.7 C)     Temp Source 05/09/19 1243 Oral     SpO2 05/09/19 1243 98 %     Weight --      Height --      Head Circumference --      Peak Flow --      Pain Score 05/09/19 1246 8     Pain Loc --  Pain Edu? --      Excl. in GC? --    No data found.  Updated Vital Signs BP 119/77 (BP Location: Left Arm)    Pulse (!) 117    Temp 98.1 F (36.7 C) (Oral)    Resp 20    SpO2 98%   Physical Exam Constitutional:      General: He is not in acute distress.    Appearance: He is well-developed. He is not diaphoretic.  HENT:     Head: Normocephalic and atraumatic.  Eyes:     Conjunctiva/sclera: Conjunctivae normal.     Pupils: Pupils are equal, round, and reactive to light.  Musculoskeletal:     Comments: Swelling to the distal 3rd-5th MCP.  Small abrasions to the distal 3rd-5th MCP.  Tenderness to palpation of distal 3rd-5th MCP.  No tenderness to palpation of the fingers.  Decreased flexion due to swelling and pain.  Strength deferred.  Sensation slightly decreased to the left 3rd-5th fingers, 4 out of 5.  Radial pulse 2+, cap refill less than 2 seconds.    Neurological:     Mental Status: He is alert and oriented to person, place, and time.    UC Treatments / Results  Labs (all labs ordered are listed, but only abnormal results are displayed) Labs Reviewed - No data to display  EKG   Radiology Dg Hand Complete Right  Result Date: 05/09/2019 CLINICAL DATA:  Posttraumatic right hand pain EXAM: RIGHT HAND - COMPLETE 3+ VIEW COMPARISON:  None. FINDINGS: There is no evidence of fracture or dislocation. There is no evidence of arthropathy or other focal bone abnormality. Soft tissues are unremarkable. IMPRESSION: Negative. Electronically Signed   By: Marnee SpringJonathon  Watts M.D.   On: 05/09/2019 12:56    Procedures Procedures (including critical care time)  Medications Ordered in UC Medications - No data to display  Initial Impression / Assessment and Plan / UC Course  I have reviewed the triage vital signs and the nursing notes.  Pertinent labs & imaging results that were available during my care of the patient were reviewed by me and considered in my medical decision making (see chart for details).    X-ray negative for fracture or dislocation.  NSAIDs, ice compress, finger splint during activity.  Discussed may take a few weeks to completely resolve, but should be feeling better each week.  Patient to follow-up with hand orthopedic if pain improves, but continues with decreased range of motion or decrease in station.  Return precautions given.  Patient expresses understanding and agrees to plan.  Final Clinical Impressions(s) / UC Diagnoses   Final diagnoses:  Right hand pain    ED Prescriptions    Medication Sig Dispense Auth. Provider   meloxicam (MOBIC) 7.5 MG tablet Take 1 tablet (7.5 mg total) by mouth daily. 10 tablet Threasa AlphaYu, Janete Quilling V, PA-C        Maritta Kief V, [place]PA-C 05/09/19 1313

## 2019-05-09 NOTE — ED Triage Notes (Signed)
Pt c/o rt hand injury, states bull dozer bucket linkage fell on his hand

## 2019-05-09 NOTE — Discharge Instructions (Signed)
X-ray negative for fracture or dislocation. Start Mobic. Do not take ibuprofen (motrin/advil)/ naproxen (aleve) while on mobic.  Ice compress, elevation, finger splint during activity.  This may take a few weeks to completely resolve, but we should be feeling better each week.  Follow-up with hand orthopedic if continues to have decreased range of motion, numbness/tingling despite resolving pain.

## 2019-12-01 IMAGING — DX RIGHT HAND - COMPLETE 3+ VIEW
3 series · 3 of 3 positions shown · non-contrast
Comparison: None.

CLINICAL DATA: Posttraumatic right hand pain

EXAM:
RIGHT HAND - COMPLETE 3+ VIEW

[hand pa]
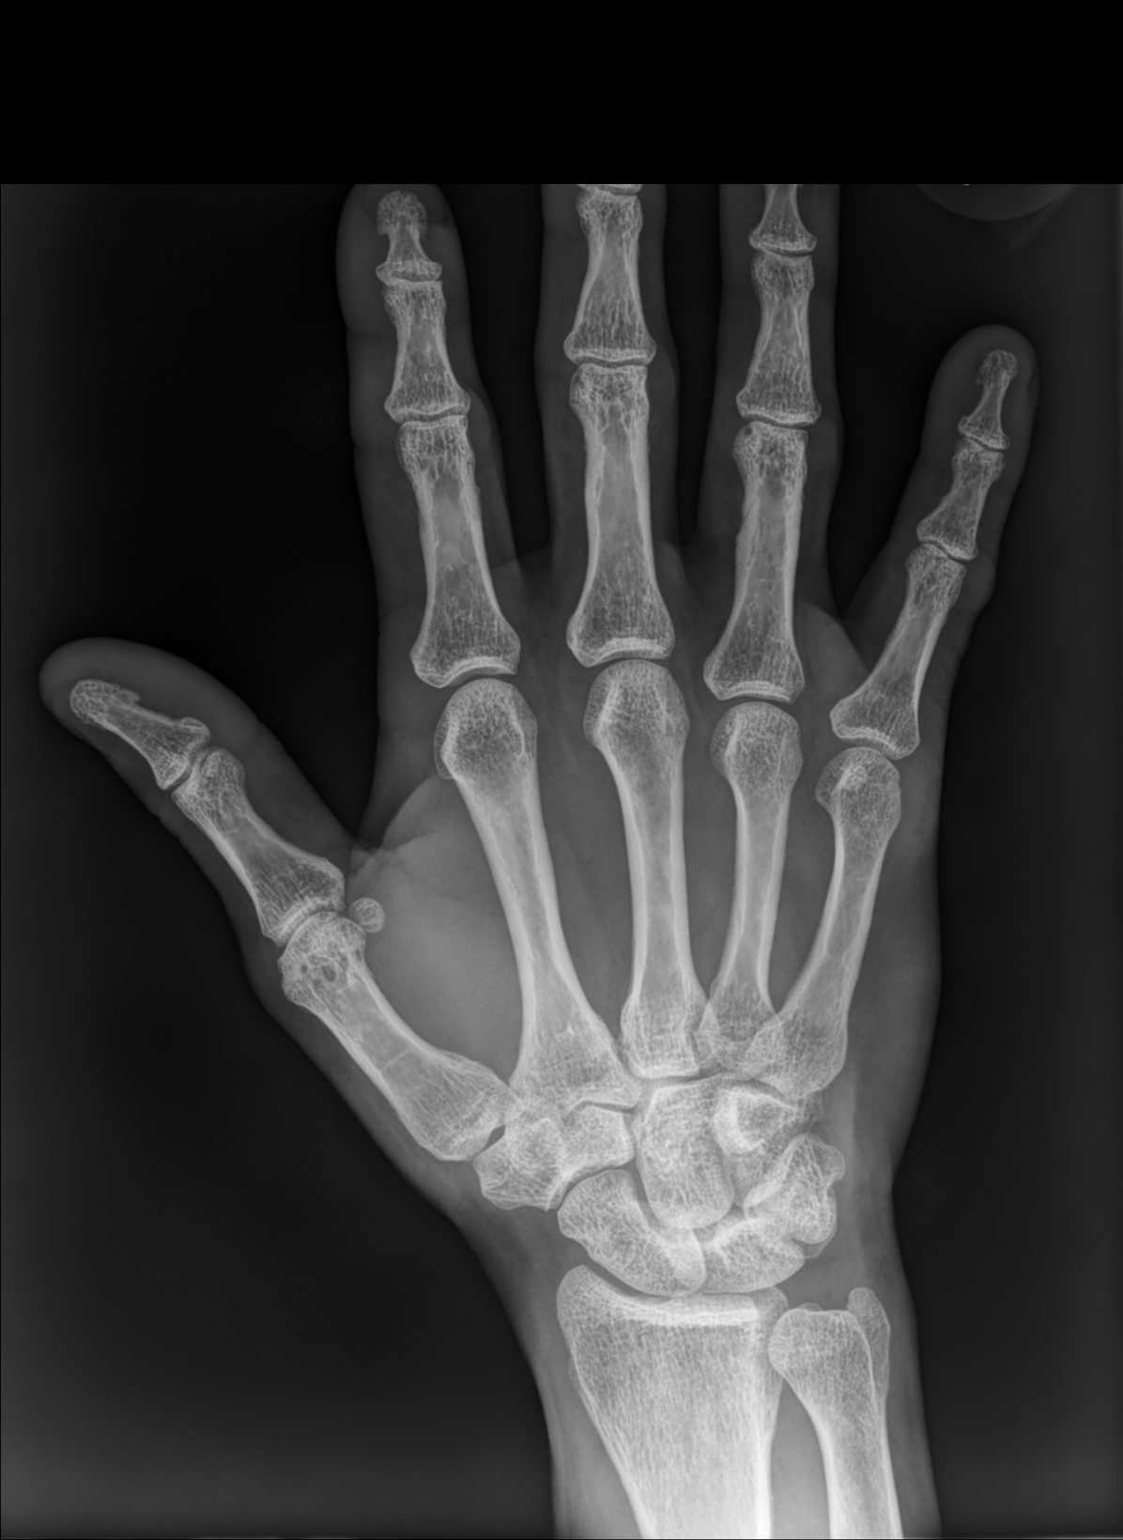

[hand mlo]
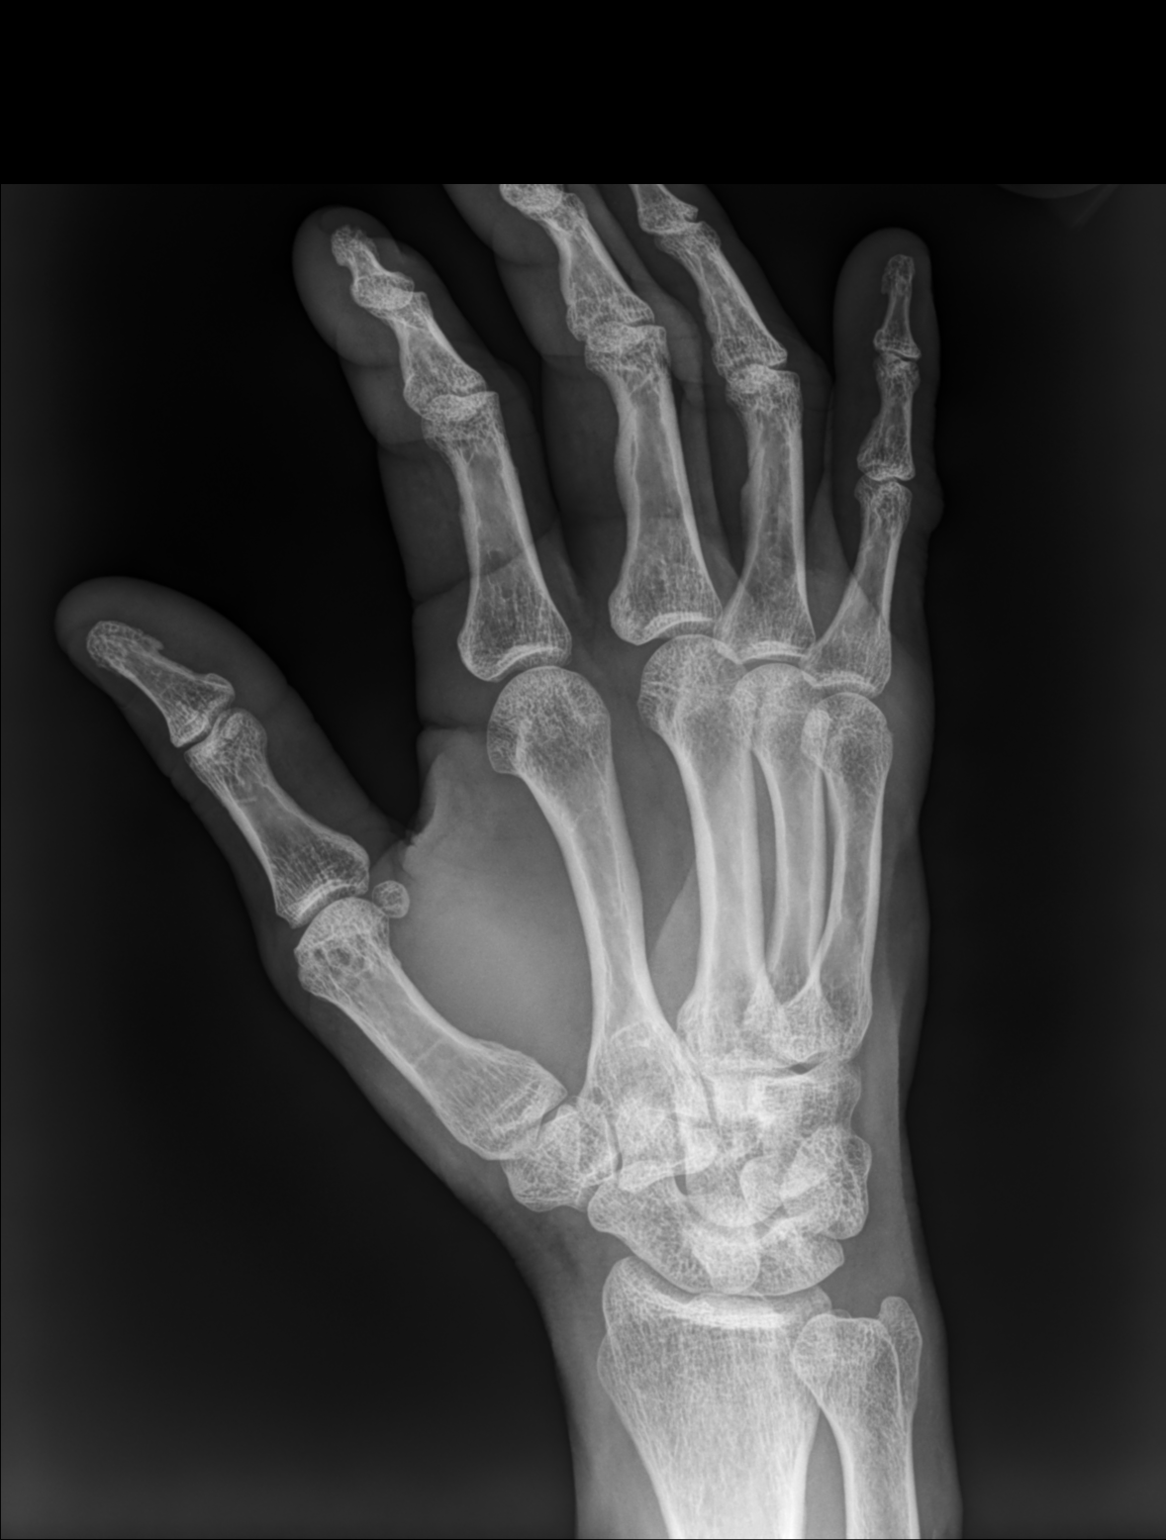

[hand lat]
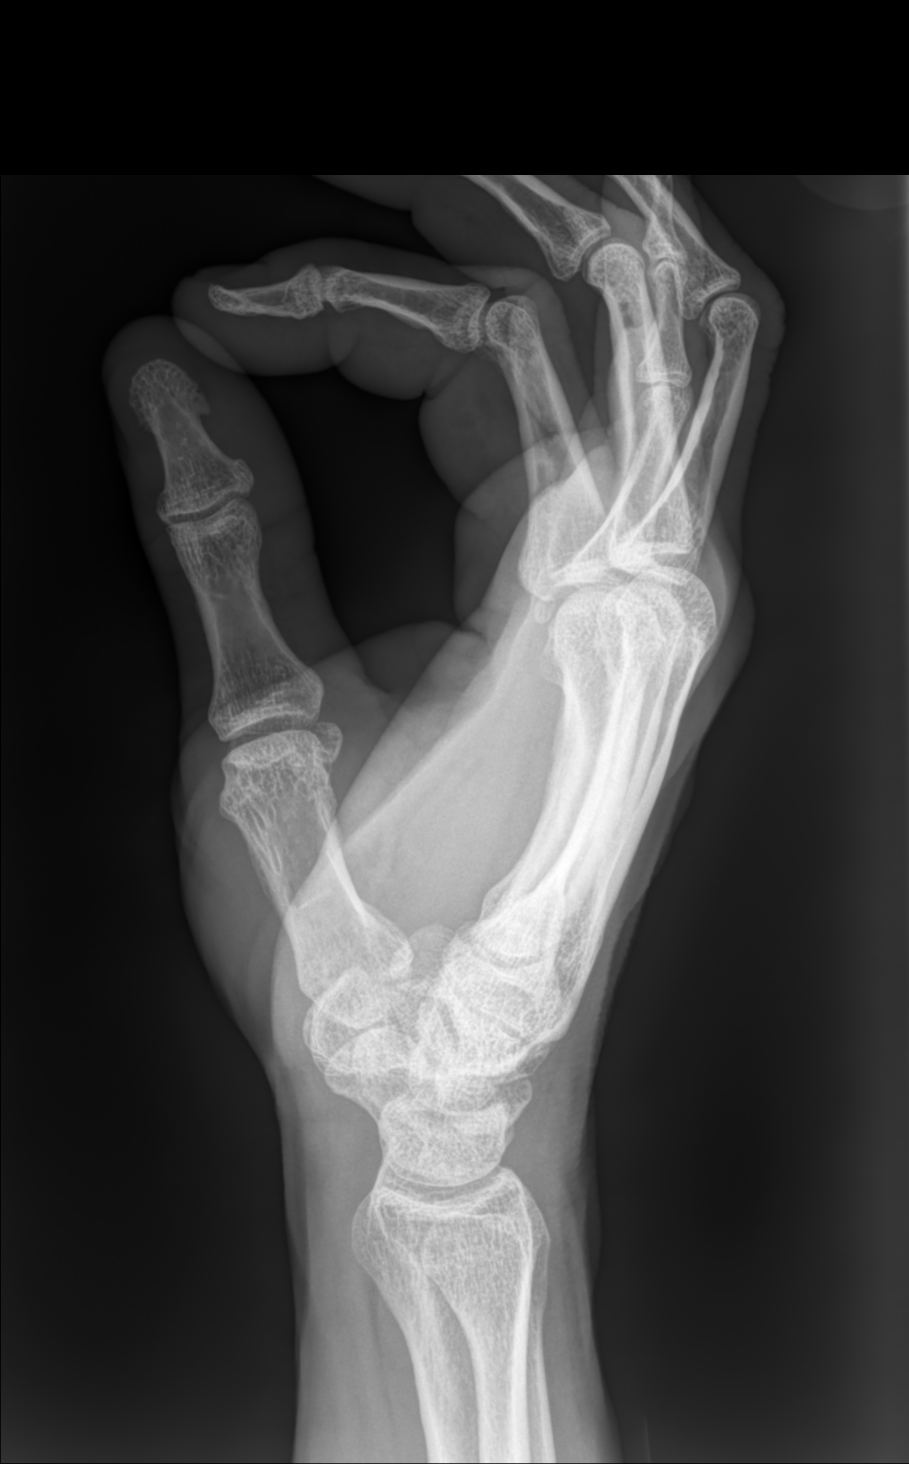

[3 of 3 positions shown; findings below may reference images not displayed]

FINDINGS: There is no evidence of fracture or dislocation. There is no
evidence of arthropathy or other focal bone abnormality. Soft
tissues are unremarkable.
IMPRESSION: Negative.

## 2020-09-22 ENCOUNTER — Ambulatory Visit
Admission: RE | Admit: 2020-09-22 | Discharge: 2020-09-22 | Disposition: A | Discharge status: Home / Self Care | Payer: 59 | Source: Ambulatory Visit | Attending: Physician Assistant | Admitting: Physician Assistant

## 2020-09-22 ENCOUNTER — Other Ambulatory Visit: Payer: Self-pay

## 2020-09-22 VITALS — BP 121/80 | HR 68 | Temp 98.2°F | Resp 17

## 2020-09-22 DIAGNOSIS — R109 Unspecified abdominal pain: Secondary | ICD-10-CM

## 2020-09-22 DIAGNOSIS — R3129 Other microscopic hematuria: Secondary | ICD-10-CM

## 2020-09-22 LAB — POCT URINALYSIS DIP (MANUAL ENTRY)
Bilirubin, UA: NEGATIVE
Glucose, UA: NEGATIVE mg/dL
Ketones, POC UA: NEGATIVE mg/dL
Leukocytes, UA: NEGATIVE
Nitrite, UA: NEGATIVE
Protein Ur, POC: NEGATIVE mg/dL
Spec Grav, UA: >=1.03 — AB (ref 1.010–1.025)
Urobilinogen, UA: 0.2 E.U./dL
pH, UA: 7 (ref 5.0–8.0)

## 2020-09-22 MED ORDER — KETOROLAC TROMETHAMINE 15 MG/ML IJ SOLN
15.0000 mg | Freq: Once | INTRAMUSCULAR | Status: AC
  Administered 2020-09-22: 15 mg via INTRAMUSCULAR

## 2020-09-22 MED ORDER — KETOROLAC TROMETHAMINE 10 MG PO TABS: 10.0000 mg | ORAL_TABLET | Freq: Four times a day (QID) | ORAL | 0 refills | Status: AC | PRN

## 2020-09-22 MED ORDER — TAMSULOSIN HCL 0.4 MG PO CAPS: 0.4000 mg | ORAL_CAPSULE | Freq: Every day | ORAL | 0 refills | Status: AC

## 2020-09-22 NOTE — ED Triage Notes (Signed)
Pt states he has been having right sided flank pain x 10 days as well as urinary urgency. Pt is aox4 and ambulatory.

## 2020-09-22 NOTE — ED Provider Notes (Signed)
[facility]    CSN: [id number] Arrival date & time: 09/22/20  1502      History   Chief Complaint Chief Complaint  Patient presents with   Flank Pain    x 10 days    HPI Gregory Solomon is a 39 y.o. male.   39 year old male comes in for 10 day history of right flank pain. Pain is intermittent, worse with movement. Denies radiation of pain. Denies abdominal pain, nausea/vomiting. Denies saddle anesthesia, loss of bladder or bowel control. Also notice some urinary urgency, urinary hesitancy. Denies fever.      Past Medical History:  Diagnosis Date   Neuromuscular disorder [facility])     Patient Active Problem List   Diagnosis Date Noted   Foot pain, bilateral 08/20/2017   Foot pain 04/21/2016   Hand pain 04/21/2016   Rotator cuff syndrome 04/21/2016   Facet syndrome ([facility]) 01/18/2015   Bilateral low back pain without sciatica 01/10/2015   Chronic pain associated with significant psychosocial dysfunction 01/10/2015   Left rotator cuff tear 01/10/2015   Chronic pain syndrome 01/10/2015   Mood disorder ([facility]) 12/14/2014   Generalized anxiety disorder 12/14/2014   Social anxiety disorder 12/14/2014    Past Surgical History:  Procedure Laterality Date   FRACTURE SURGERY         Home Medications    Prior to Admission medications   Medication Sig Start Date End Date Taking? Authorizing Provider  ketorolac (TORADOL) 10 MG tablet Take 1 tablet (10 mg total) by mouth every 6 (six) hours as needed. 09/22/20   Cathie Hoops, Rodrecus Belsky V, PA-C  tamsulosin (FLOMAX) 0.4 MG CAPS capsule Take 1 capsule (0.4 mg total) by mouth daily. 09/22/20   Belinda Fisher, PA-C    Family History Family History  Problem Relation Age of Onset   Cancer Mother    Hypertension Mother    Hypertension Father     Social History Social History   Tobacco Use   Smoking status: Former Smoker   Smokeless tobacco: Never Used  Building services engineer Use: Never used  Substance Use  Topics   Alcohol use: No    Alcohol/week: 0.0 standard drinks   Drug use: Yes    Types: Marijuana     Allergies   Hydrocodone and Penicillins   Review of Systems Review of Systems  Reason unable to perform ROS: See HPI as above.     Physical Exam Triage Vital Signs ED Triage Vitals  Enc Vitals Group     BP 09/22/20 1550 121/80     Pulse Rate 09/22/20 1550 68     Resp 09/22/20 1550 17     Temp 09/22/20 1550 98.2 F (36.8 C)     Temp Source 09/22/20 1550 Oral     SpO2 09/22/20 1550 99 %     Weight --      Height --      Head Circumference --      Peak Flow --      Pain Score 09/22/20 1552 8     Pain Loc --      Pain Edu? --      Excl. in GC? --    No data found.  Updated Vital Signs BP 121/80 (BP Location: Left Arm)    Pulse 68    Temp 98.2 F (36.8 C) (Oral)    Resp 17    SpO2 99%   Physical Exam Constitutional:      General: He is  not in acute distress.    Appearance: He is well-developed. He is not diaphoretic.  HENT:     Head: Normocephalic and atraumatic.  Eyes:     Conjunctiva/sclera: Conjunctivae normal.     Pupils: Pupils are equal, round, and reactive to light.  Cardiovascular:     Rate and Rhythm: Normal rate and regular rhythm.     Heart sounds: Normal heart sounds. No murmur heard.  No friction rub. No gallop.   Pulmonary:     Effort: Pulmonary effort is normal. No accessory muscle usage or respiratory distress.     Breath sounds: Normal breath sounds. No stridor. No decreased breath sounds, wheezing, rhonchi or rales.  Musculoskeletal:     Comments: No tenderness on palpation of the spinous processes. no tenderness to palpation of the back. Full ROM. + CVA tenderness to the right  Skin:    General: Skin is warm and dry.  Neurological:     Mental Status: He is alert and oriented to person, place, and time.      UC Treatments / Results  Labs (all labs ordered are listed, but only abnormal results are displayed) Labs Reviewed  POCT  URINALYSIS DIP (MANUAL ENTRY) - Abnormal; Notable for the following components:      Result Value   Spec Grav, UA >=1.030 (*)    Blood, UA moderate (*)    All other components within normal limits    EKG   Radiology No results found.  Procedures Procedures (including critical care time)  Medications Ordered in UC Medications  ketorolac (TORADOL) 15 MG/ML injection 15 mg (15 mg Intramuscular Given 09/22/20 1622)    Initial Impression / Assessment and Plan / UC Course  I have reviewed the triage vital signs and the nursing notes.  Pertinent labs & imaging results that were available during my care of the patient were reviewed by me and considered in my medical decision making (see chart for details).    Moderate blood in dipstick. Current history and exam most consistent with urethral stone. Toradol injection in office today. Toradol, flomax as directed. Push fluids. Return precautions given.  Final Clinical Impressions(s) / UC Diagnoses   Final diagnoses:  Right flank pain  Other microscopic hematuria    ED Prescriptions    Medication Sig Dispense Auth. Provider   ketorolac (TORADOL) 10 MG tablet Take 1 tablet (10 mg total) by mouth every 6 (six) hours as needed. 20 tablet Quamel Fitzmaurice V, PA-C   tamsulosin (FLOMAX) 0.4 MG CAPS capsule Take 1 capsule (0.4 mg total) by mouth daily. 10 capsule Belinda Fisher, PA-C     PDMP not reviewed this encounter.   Belinda Fisher, PA-C 09/22/20 1848

## 2020-09-22 NOTE — Discharge Instructions (Addendum)
As discussed, history and exam worrisome for a kidney stone.  Toradol injection in office today.  Otherwise start Toradol to help with pain.  Flomax to help move the stone. Keep hydrated, urine should be clear to pale yellow in color.  If unable to urinate, develop fever go to the emergency department for further evaluation.  Otherwise follow-up with PCP if symptoms not improving.
